# Patient Record
Sex: Male | Born: 1975 | Race: Black or African American | Hispanic: No | Marital: Single | Smoking: Light tobacco smoker
Health system: Southern US, Community
[De-identification: ages and names within clinical notes are randomized; demographics above are authoritative.]

---

## 1998-11-27 HISTORY — PX: BACK SURGERY: SHX140

## 2018-02-27 ENCOUNTER — Other Ambulatory Visit: Payer: Self-pay | Admitting: Orthopedic Surgery

## 2018-03-05 NOTE — Pre-Procedure Instructions (Signed)
Louis Robinson  03/05/2018      [id number] - [place], [place] - [address] [place] [id number] Phone: [phone] Fax: [phone]    Your procedure is scheduled on Thurs. April 11  [facility] at 11:00 A.M.  Call this number if you have problems the morning of surgery:  [phone]   Remember:  Do not eat food or drink liquids after midnight.  Take these medicines the morning of surgery with A SIP OF WATER : none             7 days prior to surgery STOP taking any Aspirin(unless otherwise instructed by your surgeon), Aleve, Naproxen, Ibuprofen, Motrin, Advil, Goody's, BC's, all herbal medications, fish oil, and all vitamins   Do not wear jewelry.  Do not wear lotions, powders, or perfumes, or deodorant.  Do not shave 48 hours prior to surgery.  Men may shave face and neck.  Do not bring valuables to the hospital.  [facility] is not responsible for any belongings or valuables.  Contacts, dentures or bridgework may not be worn into surgery.  Leave your suitcase in the car.  After surgery it may be brought to your room.  For patients admitted to the hospital, discharge time will be determined by your treatment team.  Patients discharged the day of surgery will not be allowed to drive home.    Special instructions:  [facility]- Preparing For Surgery  Before surgery, you can play an important role. Because skin is not sterile, your skin needs to be as free of germs as possible. You can reduce the number of germs on your skin by washing with CHG (chlorahexidine gluconate) Soap before surgery.  CHG is an antiseptic cleaner which kills germs and bonds with the skin to continue killing germs even after washing.  Please do not use if you have an allergy to CHG or antibacterial soaps. If your skin becomes reddened/irritated stop using the CHG.  Do not shave (including legs and underarms) for at least  48 hours prior to first CHG shower. It is OK to shave your face.  Please follow these instructions carefully.   1. Shower the NIGHT BEFORE SURGERY and the MORNING OF SURGERY with CHG.   2. If you chose to wash your hair, wash your hair first as usual with your normal shampoo.  3. After you shampoo, rinse your hair and body thoroughly to remove the shampoo.  4. Use CHG as you would any other liquid soap. You can apply CHG directly to the skin and wash gently with a scrungie or a clean washcloth.   5. Apply the CHG Soap to your body ONLY FROM THE NECK DOWN.  Do not use on open wounds or open sores. Avoid contact with your eyes, ears, mouth and genitals (private parts). Wash Face and genitals (private parts)  with your normal soap.  6. Wash thoroughly, paying special attention to the area where your surgery will be performed.  7. Thoroughly rinse your body with warm water from the neck down.  8. DO NOT shower/wash with your normal soap after using and rinsing off the CHG Soap.  9. Pat yourself dry with a CLEAN TOWEL.  10. Wear CLEAN PAJAMAS to bed the night before surgery, wear comfortable clothes the morning of surgery  11. Place CLEAN SHEETS on your bed the night of your first shower and DO NOT SLEEP WITH PETS.    Day of  Surgery: Do not apply any deodorants/lotions. Please wear clean clothes to the hospital/surgery center.      Please read over the following fact sheets that you were given. Coughing and Deep Breathing and Surgical Site Infection Prevention

## 2018-03-06 ENCOUNTER — Encounter (HOSPITAL_COMMUNITY): Payer: Self-pay

## 2018-03-06 ENCOUNTER — Encounter (HOSPITAL_COMMUNITY)
Admission: RE | Admit: 2018-03-06 | Discharge: 2018-03-06 | Disposition: A | Discharge status: Home / Self Care | Payer: BLUE CROSS/BLUE SHIELD | Source: Ambulatory Visit | Attending: Orthopedic Surgery | Admitting: Orthopedic Surgery

## 2018-03-06 ENCOUNTER — Other Ambulatory Visit: Payer: Self-pay

## 2018-03-06 DIAGNOSIS — M488X3 Other specified spondylopathies, cervicothoracic region: Secondary | ICD-10-CM | POA: Diagnosis not present

## 2018-03-06 DIAGNOSIS — M5412 Radiculopathy, cervical region: Secondary | ICD-10-CM | POA: Diagnosis present

## 2018-03-06 DIAGNOSIS — Z01818 Encounter for other preprocedural examination: Secondary | ICD-10-CM

## 2018-03-06 DIAGNOSIS — F17290 Nicotine dependence, other tobacco product, uncomplicated: Secondary | ICD-10-CM | POA: Diagnosis not present

## 2018-03-06 LAB — COMPREHENSIVE METABOLIC PANEL
ALBUMIN: 3.9 g/dL (ref 3.5–5.0)
ALT: 11 U/L — ABNORMAL LOW (ref 17–63)
ANION GAP: 12 (ref 5–15)
AST: 19 U/L (ref 15–41)
Alkaline Phosphatase: 80 U/L (ref 38–126)
BILIRUBIN TOTAL: 0.4 mg/dL (ref 0.3–1.2)
BUN: 8 mg/dL (ref 6–20)
CO2: 22 mmol/L (ref 22–32)
Calcium: 9.2 mg/dL (ref 8.9–10.3)
Chloride: 106 mmol/L (ref 101–111)
Creatinine, Ser: 1.29 mg/dL — ABNORMAL HIGH (ref 0.61–1.24)
GFR calc Af Amer: >60 mL/min (ref >60)
GFR calc non Af Amer: >60 mL/min (ref >60)
GLUCOSE: 90 mg/dL (ref 65–99)
POTASSIUM: 3.8 mmol/L (ref 3.5–5.1)
SODIUM: 140 mmol/L (ref 135–145)
TOTAL PROTEIN: 6.9 g/dL (ref 6.5–8.1)

## 2018-03-06 LAB — SURGICAL PCR SCREEN
MRSA, PCR: NEGATIVE
STAPHYLOCOCCUS AUREUS: NEGATIVE

## 2018-03-06 LAB — URINALYSIS, ROUTINE W REFLEX MICROSCOPIC
Bilirubin Urine: NEGATIVE
Glucose, UA: NEGATIVE mg/dL
HGB URINE DIPSTICK: NEGATIVE
Ketones, ur: NEGATIVE mg/dL
LEUKOCYTES UA: NEGATIVE
NITRITE: NEGATIVE
PH: 6 (ref 5.0–8.0)
Protein, ur: NEGATIVE mg/dL
Specific Gravity, Urine: 1.003 — ABNORMAL LOW (ref 1.005–1.030)

## 2018-03-06 LAB — CBC WITH DIFFERENTIAL/PLATELET
BASOS PCT: 0 %
Basophils Absolute: 0 10*3/uL (ref 0.0–0.1)
EOS ABS: 1.4 10*3/uL — AB (ref 0.0–0.7)
Eosinophils Relative: 14 %
HEMATOCRIT: 40.7 % (ref 39.0–52.0)
Hemoglobin: 13.4 g/dL (ref 13.0–17.0)
Lymphocytes Relative: 30 %
Lymphs Abs: 2.9 10*3/uL (ref 0.7–4.0)
MCH: 32.9 pg (ref 26.0–34.0)
MCHC: 32.9 g/dL (ref 30.0–36.0)
MCV: 100 fL (ref 78.0–100.0)
MONO ABS: 0.7 10*3/uL (ref 0.1–1.0)
MONOS PCT: 7 %
Neutro Abs: 4.8 10*3/uL (ref 1.7–7.7)
Neutrophils Relative %: 49 %
Platelets: 233 10*3/uL (ref 150–400)
RBC: 4.07 MIL/uL — ABNORMAL LOW (ref 4.22–5.81)
RDW: 14.6 % (ref 11.5–15.5)
WBC: 9.8 10*3/uL (ref 4.0–10.5)

## 2018-03-06 LAB — PROTIME-INR
INR: 0.98
Prothrombin Time: 12.9 seconds (ref 11.4–15.2)

## 2018-03-06 LAB — APTT: aPTT: 32 seconds (ref 24–36)

## 2018-03-06 NOTE — Progress Notes (Signed)
NO PCP

## 2018-03-07 ENCOUNTER — Ambulatory Visit (HOSPITAL_COMMUNITY)
Admission: RE | Disposition: A | Discharge status: Home / Self Care | Payer: Self-pay | Source: Ambulatory Visit | Attending: Orthopedic Surgery

## 2018-03-07 ENCOUNTER — Ambulatory Visit (HOSPITAL_COMMUNITY)
Admission: RE | Admit: 2018-03-07 | Discharge: 2018-03-07 | Disposition: A | Discharge status: Home / Self Care | Payer: BLUE CROSS/BLUE SHIELD | Source: Ambulatory Visit | Attending: Orthopedic Surgery | Admitting: Orthopedic Surgery

## 2018-03-07 ENCOUNTER — Encounter (HOSPITAL_COMMUNITY): Payer: Self-pay | Admitting: *Deleted

## 2018-03-07 ENCOUNTER — Ambulatory Visit (HOSPITAL_COMMUNITY): Payer: BLUE CROSS/BLUE SHIELD

## 2018-03-07 ENCOUNTER — Ambulatory Visit (HOSPITAL_COMMUNITY): Payer: BLUE CROSS/BLUE SHIELD | Admitting: Certified Registered"

## 2018-03-07 DIAGNOSIS — M488X3 Other specified spondylopathies, cervicothoracic region: Secondary | ICD-10-CM | POA: Insufficient documentation

## 2018-03-07 DIAGNOSIS — M5412 Radiculopathy, cervical region: Secondary | ICD-10-CM | POA: Diagnosis not present

## 2018-03-07 DIAGNOSIS — F17290 Nicotine dependence, other tobacco product, uncomplicated: Secondary | ICD-10-CM | POA: Insufficient documentation

## 2018-03-07 DIAGNOSIS — M5010 Cervical disc disorder with radiculopathy, unspecified cervical region: Secondary | ICD-10-CM

## 2018-03-07 DIAGNOSIS — Z419 Encounter for procedure for purposes other than remedying health state, unspecified: Secondary | ICD-10-CM

## 2018-03-07 HISTORY — PX: POSTERIOR CERVICAL FUSION/FORAMINOTOMY: SHX5038

## 2018-03-07 SURGERY — POSTERIOR CERVICAL FUSION/FORAMINOTOMY LEVEL 1
Anesthesia: General | Laterality: Right

## 2018-03-07 MED ORDER — LACTATED RINGERS IV SOLN
INTRAVENOUS | Status: DC
  Administered 2018-03-07 (×2): via INTRAVENOUS

## 2018-03-07 MED ORDER — CEFAZOLIN SODIUM-DEXTROSE 2-4 GM/100ML-% IV SOLN
2.0000 g | INTRAVENOUS | Status: AC
  Administered 2018-03-07: 2 g via INTRAVENOUS

## 2018-03-07 MED ORDER — BUPIVACAINE-EPINEPHRINE (PF) 0.25% -1:200000 IJ SOLN
INTRAMUSCULAR | Status: AC
Start: 2018-03-07 — End: 2018-03-07
  Filled 2018-03-07: qty 30

## 2018-03-07 MED ORDER — BUPIVACAINE LIPOSOME 1.3 % IJ SUSP
20.0000 mL | INTRAMUSCULAR | Status: AC
  Administered 2018-03-07: 20 mL
  Filled 2018-03-07: qty 20

## 2018-03-07 MED ORDER — FENTANYL CITRATE (PF) 250 MCG/5ML IJ SOLN
INTRAMUSCULAR | Status: AC
  Filled 2018-03-07: qty 5

## 2018-03-07 MED ORDER — MIDAZOLAM HCL 2 MG/2ML IJ SOLN
INTRAMUSCULAR | Status: AC
Start: 2018-03-07 — End: 2018-03-07
  Filled 2018-03-07: qty 2

## 2018-03-07 MED ORDER — THROMBIN (RECOMBINANT) 20000 UNITS EX SOLR
CUTANEOUS | Status: DC | PRN
  Administered 2018-03-07: 20000 [IU] via TOPICAL

## 2018-03-07 MED ORDER — CEFAZOLIN SODIUM-DEXTROSE 2-4 GM/100ML-% IV SOLN
INTRAVENOUS | Status: AC
  Filled 2018-03-07: qty 100

## 2018-03-07 MED ORDER — POVIDONE-IODINE 7.5 % EX SOLN
Freq: Once | CUTANEOUS | Status: AC
  Administered 2018-03-07: 1 via TOPICAL
  Filled 2018-03-07: qty 118

## 2018-03-07 MED ORDER — LIDOCAINE 2% (20 MG/ML) 5 ML SYRINGE
INTRAMUSCULAR | Status: AC
  Filled 2018-03-07: qty 5

## 2018-03-07 MED ORDER — METHYLPREDNISOLONE ACETATE 40 MG/ML IJ SUSP
INTRAMUSCULAR | Status: AC
Start: 2018-03-07 — End: 2018-03-07
  Filled 2018-03-07: qty 1

## 2018-03-07 MED ORDER — BUPIVACAINE-EPINEPHRINE 0.25% -1:200000 IJ SOLN
INTRAMUSCULAR | Status: DC | PRN
  Administered 2018-03-07: 20 mL
  Administered 2018-03-07: 8 mL

## 2018-03-07 MED ORDER — METHYLPREDNISOLONE ACETATE 40 MG/ML IJ SUSP
INTRAMUSCULAR | Status: DC | PRN
  Administered 2018-03-07: 40 mg

## 2018-03-07 MED ORDER — LIDOCAINE 2% (20 MG/ML) 5 ML SYRINGE
INTRAMUSCULAR | Status: DC | PRN
  Administered 2018-03-07 (×2): 60 mg via INTRAVENOUS

## 2018-03-07 MED ORDER — MIDAZOLAM HCL 5 MG/5ML IJ SOLN
INTRAMUSCULAR | Status: DC | PRN
  Administered 2018-03-07: 2 mg via INTRAVENOUS

## 2018-03-07 MED ORDER — ONDANSETRON HCL 4 MG/2ML IJ SOLN
INTRAMUSCULAR | Status: DC | PRN
  Administered 2018-03-07: 4 mg via INTRAVENOUS

## 2018-03-07 MED ORDER — THROMBIN 5000 UNITS EX SOLR
CUTANEOUS | Status: DC | PRN
  Administered 2018-03-07: 5000 [IU] via TOPICAL

## 2018-03-07 MED ORDER — FENTANYL CITRATE (PF) 250 MCG/5ML IJ SOLN
INTRAMUSCULAR | Status: DC | PRN
  Administered 2018-03-07: 100 ug via INTRAVENOUS
  Administered 2018-03-07 (×2): 50 ug via INTRAVENOUS

## 2018-03-07 MED ORDER — ROCURONIUM BROMIDE 10 MG/ML (PF) SYRINGE
PREFILLED_SYRINGE | INTRAVENOUS | Status: DC | PRN
  Administered 2018-03-07 (×2): 40 mg via INTRAVENOUS
  Administered 2018-03-07: 50 mg via INTRAVENOUS
  Administered 2018-03-07: 20 mg via INTRAVENOUS

## 2018-03-07 MED ORDER — ROCURONIUM BROMIDE 10 MG/ML (PF) SYRINGE
PREFILLED_SYRINGE | INTRAVENOUS | Status: AC
  Filled 2018-03-07: qty 5

## 2018-03-07 MED ORDER — THROMBIN 20000 UNITS EX SOLR
CUTANEOUS | Status: AC
  Filled 2018-03-07: qty 20000

## 2018-03-07 MED ORDER — BACITRACIN ZINC 500 UNIT/GM EX OINT
TOPICAL_OINTMENT | CUTANEOUS | Status: DC | PRN
  Administered 2018-03-07: 1 via TOPICAL

## 2018-03-07 MED ORDER — PHENYLEPHRINE 40 MCG/ML (10ML) SYRINGE FOR IV PUSH (FOR BLOOD PRESSURE SUPPORT)
PREFILLED_SYRINGE | INTRAVENOUS | Status: DC | PRN
  Administered 2018-03-07: 80 ug via INTRAVENOUS

## 2018-03-07 MED ORDER — BACITRACIN ZINC 500 UNIT/GM EX OINT
TOPICAL_OINTMENT | CUTANEOUS | Status: AC
  Filled 2018-03-07: qty 28.35

## 2018-03-07 MED ORDER — THROMBIN 5000 UNITS EX SOLR
CUTANEOUS | Status: AC
  Filled 2018-03-07: qty 5000

## 2018-03-07 MED ORDER — PROPOFOL 10 MG/ML IV BOLUS
INTRAVENOUS | Status: DC | PRN
  Administered 2018-03-07: 200 mg via INTRAVENOUS

## 2018-03-07 MED ORDER — SUGAMMADEX SODIUM 200 MG/2ML IV SOLN
INTRAVENOUS | Status: DC | PRN
  Administered 2018-03-07 (×2): 200 mg via INTRAVENOUS

## 2018-03-07 MED ORDER — 0.9 % SODIUM CHLORIDE (POUR BTL) OPTIME
TOPICAL | Status: DC | PRN
  Administered 2018-03-07: 1000 mL

## 2018-03-07 MED ORDER — HYDROMORPHONE HCL 1 MG/ML IJ SOLN: 0.2500 mg | INTRAMUSCULAR | Status: DC | PRN

## 2018-03-07 MED ORDER — PHENYLEPHRINE HCL 10 MG/ML IJ SOLN
INTRAVENOUS | Status: DC | PRN
  Administered 2018-03-07: 15 ug/min via INTRAVENOUS

## 2018-03-07 SURGICAL SUPPLY — 64 items
BENZOIN TINCTURE PRP APPL 2/3 (GAUZE/BANDAGES/DRESSINGS) ×3 IMPLANT
BLADE CLIPPER SURG NEURO (BLADE) ×3 IMPLANT
BUR NEURO DRILL SOFT 3.0X3.8M (BURR) ×3 IMPLANT
BUR PRESCISION 1.7 ELITE (BURR) ×3 IMPLANT
CLOSURE WOUND 1/2 X4 (GAUZE/BANDAGES/DRESSINGS) ×1
COLLAR CERV PROCARE ST 2.25 (SOFTGOODS) ×3 IMPLANT
CONT SPEC 4OZ CLIKSEAL STRL BL (MISCELLANEOUS) ×3 IMPLANT
CORDS BIPOLAR (ELECTRODE) ×3 IMPLANT
COVER BACK TABLE 80X110 HD (DRAPES) ×3 IMPLANT
COVER MAYO STAND STRL (DRAPES) ×3 IMPLANT
COVER SURGICAL LIGHT HANDLE (MISCELLANEOUS) ×3 IMPLANT
DRAIN CHANNEL 15F RND FF W/TCR (WOUND CARE) ×3 IMPLANT
DRAPE C-ARM 42X72 X-RAY (DRAPES) ×3 IMPLANT
DRAPE HALF SHEET 40X57 (DRAPES) ×15 IMPLANT
DRAPE INCISE IOBAN 66X45 STRL (DRAPES) ×3 IMPLANT
DRAPE PED LAPAROTOMY (DRAPES) ×3 IMPLANT
DRAPE POUCH INSTRU U-SHP 10X18 (DRAPES) ×3 IMPLANT
DRAPE SURG 17X23 STRL (DRAPES) ×24 IMPLANT
DRSG MEPILEX BORDER 4X8 (GAUZE/BANDAGES/DRESSINGS) ×3 IMPLANT
DURAPREP 26ML APPLICATOR (WOUND CARE) ×3 IMPLANT
ELECT CAUTERY BLADE 6.4 (BLADE) ×3 IMPLANT
ELECT REM PT RETURN 9FT ADLT (ELECTROSURGICAL) ×3
ELECTRODE REM PT RTRN 9FT ADLT (ELECTROSURGICAL) ×1 IMPLANT
EVACUATOR SILICONE 100CC (DRAIN) ×3 IMPLANT
GAUZE SPONGE 4X4 12PLY STRL (GAUZE/BANDAGES/DRESSINGS) ×3 IMPLANT
GAUZE SPONGE 4X4 16PLY XRAY LF (GAUZE/BANDAGES/DRESSINGS) ×3 IMPLANT
GLOVE BIO SURGEON STRL SZ7 (GLOVE) ×3 IMPLANT
GLOVE BIO SURGEON STRL SZ8 (GLOVE) ×3 IMPLANT
GLOVE BIOGEL PI IND STRL 8 (GLOVE) ×1 IMPLANT
GLOVE BIOGEL PI INDICATOR 8 (GLOVE) ×2
GOWN STRL REUS W/ TWL LRG LVL3 (GOWN DISPOSABLE) ×4 IMPLANT
GOWN STRL REUS W/ TWL XL LVL3 (GOWN DISPOSABLE) ×1 IMPLANT
GOWN STRL REUS W/TWL LRG LVL3 (GOWN DISPOSABLE) ×8
GOWN STRL REUS W/TWL XL LVL3 (GOWN DISPOSABLE) ×2
IV CATH 14GX2 1/4 (CATHETERS) ×3 IMPLANT
KIT BASIN OR (CUSTOM PROCEDURE TRAY) ×3 IMPLANT
KIT TURNOVER KIT B (KITS) ×3 IMPLANT
MATRIX HEMOSTAT SURGIFLO (HEMOSTASIS) ×3 IMPLANT
NEEDLE HYPO 25GX1X1/2 BEV (NEEDLE) ×3 IMPLANT
NS IRRIG 1000ML POUR BTL (IV SOLUTION) ×3 IMPLANT
PACK LAMINECTOMY ORTHO (CUSTOM PROCEDURE TRAY) ×3 IMPLANT
PAD ARMBOARD 7.5X6 YLW CONV (MISCELLANEOUS) ×6 IMPLANT
PATTIES SURGICAL .5 X.5 (GAUZE/BANDAGES/DRESSINGS) ×3 IMPLANT
PIN MAYFIELD SKULL DISP (PIN) ×3 IMPLANT
SPONGE GAUZE 4X4 16PLY UNSTER (WOUND CARE) ×3 IMPLANT
SPONGE INTESTINAL PEANUT (DISPOSABLE) IMPLANT
SPONGE SURGIFOAM ABS GEL 100 (HEMOSTASIS) ×3 IMPLANT
STRIP CLOSURE SKIN 1/2X4 (GAUZE/BANDAGES/DRESSINGS) ×2 IMPLANT
SURGIFLO W/THROMBIN 8M KIT (HEMOSTASIS) ×3 IMPLANT
SUT MNCRL AB 4-0 PS2 18 (SUTURE) ×3 IMPLANT
SUT VIC AB 0 CT1 18XCR BRD 8 (SUTURE) ×1 IMPLANT
SUT VIC AB 0 CT1 8-18 (SUTURE) ×2
SUT VIC AB 1 CT1 18XCR BRD 8 (SUTURE) ×2 IMPLANT
SUT VIC AB 1 CT1 8-18 (SUTURE) ×4
SUT VIC AB 2-0 CT2 18 VCP726D (SUTURE) ×3 IMPLANT
SYR BULB IRRIGATION 50ML (SYRINGE) ×3 IMPLANT
SYR CONTROL 10ML LL (SYRINGE) ×9 IMPLANT
TAPE CLOTH 4X10 WHT NS (GAUZE/BANDAGES/DRESSINGS) ×3 IMPLANT
TAPE CLOTH SURG 4X10 WHT LF (GAUZE/BANDAGES/DRESSINGS) ×3 IMPLANT
TOWEL OR 17X24 6PK STRL BLUE (TOWEL DISPOSABLE) ×3 IMPLANT
TOWEL OR 17X26 10 PK STRL BLUE (TOWEL DISPOSABLE) ×3 IMPLANT
TRAY FOLEY W/METER SILVER 16FR (SET/KITS/TRAYS/PACK) IMPLANT
WATER STERILE IRR 1000ML POUR (IV SOLUTION) IMPLANT
YANKAUER SUCT BULB TIP NO VENT (SUCTIONS) ×3 IMPLANT

## 2018-03-07 NOTE — Anesthesia Postprocedure Evaluation (Signed)
Anesthesia Post Note  Patient: Louis Robinson  Procedure(s) Performed: CERVICAL 7 - THORACIC 1 POSTERIOR DECOMPRESSION (Right )     Patient location during evaluation: PACU Anesthesia Type: General Level of consciousness: awake and alert Pain management: pain level controlled Vital Signs Assessment: post-procedure vital signs reviewed and stable Respiratory status: spontaneous breathing, nonlabored ventilation and respiratory function stable Cardiovascular status: blood pressure returned to baseline and stable Postop Assessment: no apparent nausea or vomiting Anesthetic complications: no    Last Vitals:  Vitals:   03/07/18 1629 03/07/18 1641  BP: (!) 147/92   Pulse: 66   Resp:    Temp:  36.7 C  SpO2: 100%     Last Pain:  Vitals:   03/07/18 1641  PainSc: 0-No pain                 Kenidi Elenbaas,W. EDMOND

## 2018-03-07 NOTE — Anesthesia Procedure Notes (Addendum)
Procedure Name: Intubation Date/Time: 03/07/2018 12:37 PM Performed by: Freddie Breech, CRNA Pre-anesthesia Checklist: Patient identified, Emergency Drugs available, Suction available and Patient being monitored Patient Re-evaluated:Patient Re-evaluated prior to induction Oxygen Delivery Method: Circle System Utilized Preoxygenation: Pre-oxygenation with 100% oxygen Induction Type: IV induction Ventilation: Mask ventilation without difficulty Laryngoscope Size: Glidescope and 4 Grade View: Grade I Tube type: Oral Tube size: 7.5 mm Number of attempts: 1 Airway Equipment and Method: Stylet,  Oral airway,  Video-laryngoscopy and LTA kit utilized Placement Confirmation: positive ETCO2 and breath sounds checked- equal and bilateral (ETT placement through open vocal cords confirmed with video laryngoscopy.  ) Secured at: 23 cm Tube secured with: Tape Dental Injury: Teeth and Oropharynx as per pre-operative assessment

## 2018-03-07 NOTE — Anesthesia Preprocedure Evaluation (Addendum)
Anesthesia Evaluation  Patient identified by MRN, date of birth, ID band Patient awake    Reviewed: Allergy & Precautions, H&P , NPO status , Patient's Chart, lab work & pertinent test results  Airway Mallampati: II  TM Distance: >3 FB Neck ROM: Full    Dental no notable dental hx. (+) Teeth Intact, Dental Advisory Given   Pulmonary Current Smoker,    Pulmonary exam normal breath sounds clear to auscultation       Cardiovascular negative cardio ROS   Rhythm:Regular Rate:Normal     Neuro/Psych negative neurological ROS  negative psych ROS   GI/Hepatic negative GI ROS, Neg liver ROS,   Endo/Other  negative endocrine ROS  Renal/GU negative Renal ROS  negative genitourinary   Musculoskeletal   Abdominal   Peds  Hematology negative hematology ROS (+)   Anesthesia Other Findings   Reproductive/Obstetrics negative OB ROS                            Anesthesia Physical Anesthesia Plan  ASA: II  Anesthesia Plan: General   Post-op Pain Management:    Induction: Intravenous  PONV Risk Score and Plan: 2 and Ondansetron, Dexamethasone and Midazolam  Airway Management Planned: Oral ETT  Additional Equipment:   Intra-op Plan:   Post-operative Plan: Extubation in OR  Informed Consent: I have reviewed the patients History and Physical, chart, labs and discussed the procedure including the risks, benefits and alternatives for the proposed anesthesia with the patient or authorized representative who has indicated his/her understanding and acceptance.     Dental advisory given  Plan Discussed with: CRNA  Anesthesia Plan Comments:         Anesthesia Quick Evaluation  

## 2018-03-07 NOTE — Transfer of Care (Signed)
Immediate Anesthesia Transfer of Care Note  Patient: Louis Robinson  Procedure(s) Performed: CERVICAL 7 - THORACIC 1 POSTERIOR DECOMPRESSION (Right )  Patient Location: PACU  Anesthesia Type:General  Level of Consciousness: drowsy and patient cooperative  Airway & Oxygen Therapy: Patient Spontanous Breathing  Post-op Assessment: Report given to RN, Post -op Vital signs reviewed and stable and Patient moving all extremities X 4  Post vital signs: Reviewed and stable  Last Vitals:  Vitals Value Taken Time  BP 149/96 03/07/2018  3:36 PM  Temp    Pulse 79 03/07/2018  3:38 PM  Resp 24 03/07/2018  3:38 PM  SpO2 97 % 03/07/2018  3:38 PM  Vitals shown include unvalidated device data.  Last Pain:  Vitals:   03/07/18 0944  PainSc: 0-No pain      Patients Stated Pain Goal: 4 (03/07/18 0944)  Complications: No apparent anesthesia complications

## 2018-03-07 NOTE — H&P (Signed)
     PREOPERATIVE H&P  Chief Complaint: Right arm pain  HPI: Louis Robinson is a 42 y.o. male who presents with ongoing pain in the right arm  MRI reveals NF compression on the right at C7/T1  Patient has failed multiple forms of conservative care and continues to have pain (see office notes for additional details regarding the patient's full course of treatment)  No past medical history on file. Past Surgical History:  Procedure Laterality Date  . BACK SURGERY  2000   Social History   Socioeconomic History  . Marital status: Single    Spouse name: Not on file  . Number of children: Not on file  . Years of education: Not on file  . Highest education level: Not on file  Occupational History  . Not on file  Social Needs  . Financial resource strain: Not on file  . Food insecurity:    Worry: Not on file    Inability: Not on file  . Transportation needs:    Medical: Not on file    Non-medical: Not on file  Tobacco Use  . Smoking status: Light Tobacco Smoker    Years: 2.00    Types: Cigars  . Smokeless tobacco: Never Used  . Tobacco comment: onec in awhile  Substance and Sexual Activity  . Alcohol use: Never    Frequency: Never  . Drug use: Not Currently    Types: Marijuana    Comment: last usage several yrs. ago  . Sexual activity: Not on file  Lifestyle  . Physical activity:    Days per week: Not on file    Minutes per session: Not on file  . Stress: Not on file  Relationships  . Social connections:    Talks on phone: Not on file    Gets together: Not on file    Attends religious service: Not on file    Active member of club or organization: Not on file    Attends meetings of clubs or organizations: Not on file    Relationship status: Not on file  Other Topics Concern  . Not on file  Social History Narrative  . Not on file   No family history on file. No Known Allergies Prior to Admission medications   Not on File     All other systems have  been reviewed and were otherwise negative with the exception of those mentioned in the HPI and as above.  Physical Exam: There were no vitals filed for this visit.  There is no height or weight on file to calculate BMI.  General: Alert, no acute distress Cardiovascular: No pedal edema Respiratory: No cyanosis, no use of accessory musculature Skin: No lesions in the area of chief complaint Neurologic: Sensation intact distally Psychiatric: Patient is competent for consent with normal mood and affect Lymphatic: No axillary or cervical lymphadenopathy   Assessment/Plan: RIGHT ARM PAIN Plan for Procedure(s): CERVICAL 7 - THORACIC 1 POSTERIOR DECOMPRESSION   Louis Robinson,Louis Faraci LEONARD, MD 03/07/2018 6:31 AM

## 2018-03-07 NOTE — Op Note (Signed)
NAME:  Louis Robinson             MEDICAL RECORD NO.:  [id number]  PHYSICIAN:  Estill Bamberg, MD      DATE OF BIRTH:  Sep 29, 1976  DATE OF PROCEDURE:  03/07/2018                               OPERATIVE REPORT   PREOPERATIVE DIAGNOSES: 1. Right C8 radiculopathy 2, Right C7/T1 facet hypertrophy, compressing the right C8 nerve  POSTOPERATIVE DIAGNOSES: 1. Right C8 radiculopathy 2, Right C7/T1 facet hypertrophy, compressing the right C8 nerve  PROCEDURE:   1. Right C7/T1 laminotomy, partial facetectomy and foraminotomy with decompression of right C8 nerve 2. Mayfield headholder placement and removal  SURGEON:  Estill Bamberg, MD.  ASSISTANTJason Coop, PA-C.  ANESTHESIA:  General endotracheal anesthesia.  COMPLICATIONS:  None.  DISPOSITION:  Stable.  ESTIMATED BLOOD LOSS:  Minimal.  INDICATIONS FOR SURGERY:  Briefly, Louis Robinson is a very pleasant 42 year-old male, who did present to me with pain in the right arm. The patient's MRI did reveal the findings noted above.  We did proceed with appropriate conservative treatment, but the patient did continue to have ongoing pain, which he did feel was limiting his function substantially.  Given the patient's ongoing pain and dysfunction, we did discuss proceeding with the procedure reflected above.  The patient was fully aware of the risks and limitations of surgery and did wish to proceed.  OPERATIVE DETAILS:  On 03/07/2018, the patient was brought to surgery and general endotracheal anesthesia was administered.  A Mayfield head holder was then applied, and the patient was then rolled prone, and the head positioned into the appropriate position. Antibiotics were given and the back was prepped and draped in the usual sterile fashion.  A time-out procedure was performed.  I then made a midline incision overlying the C7-T1 intervertebral space.  The fascia was incised to the right of the midline.  The paraspinal musculature  was bluntly retracted laterally and held retracted with a self-retaining retractor. After confirming the appropriate operative level, I did perform a partial facetectomy on the right at C7-T1. Of note, there was very substantial and very significant overgrowth of the facet joint, which was clearly compressing the right C8 nerve. The right C8 nerve was noted to be very swollen and erythematous due to the compression.  I proceeded with the partial facetectomy, until I was easily able to pass a nerve hook lateral to the C7 and T1 pedicles, confirming adequate decompression of the exiting right C8 nerve.  I was very pleased with the final decompression.  All bleeding was then adequately controlled at the termination of the decompression.  At this point, 40 mg of Depo-Medrol was introduced about the region of the exiting C8 nerve.  Prior to this, the wound was copiously irrigated with a total of approximately 1 L of normal saline.  There was no extravasation of cerebrospinal fluid noted throughout the entire surgery.  At this point, the wound was closed in layers using #1 Vicryl, followed by 2-0 Vicryl, followed by 4- 0 Monocryl.  Benzoin and Steri-Strips were applied, followed by a sterile dressing.  The patient was then rolled into the supine position, and the Mayfield head holder was removed. All instrument counts were correct at the termination of the procedure.  Of note, Jason Coop, PA-C, was my assistant throughout surgery, and did aid in retraction,  suctioning, and closure from start to finish.   Estill BambergMark Wendle Kina, MD

## 2018-03-08 ENCOUNTER — Encounter (HOSPITAL_COMMUNITY): Payer: Self-pay | Admitting: Orthopedic Surgery

## 2018-03-08 MED FILL — Thrombin For Soln 20000 Unit: CUTANEOUS | Qty: 1 | Status: AC

## 2019-07-24 IMAGING — CR DG CHEST 2V
2 series · 2 of 2 positions shown · non-contrast
Comparison: None.

CLINICAL DATA: Pre-op for cervical fusion surgery - part time
smoker, no known heart or lung issues, no chest complaints

EXAM:
CHEST - 2 VIEW

[w chest pa]
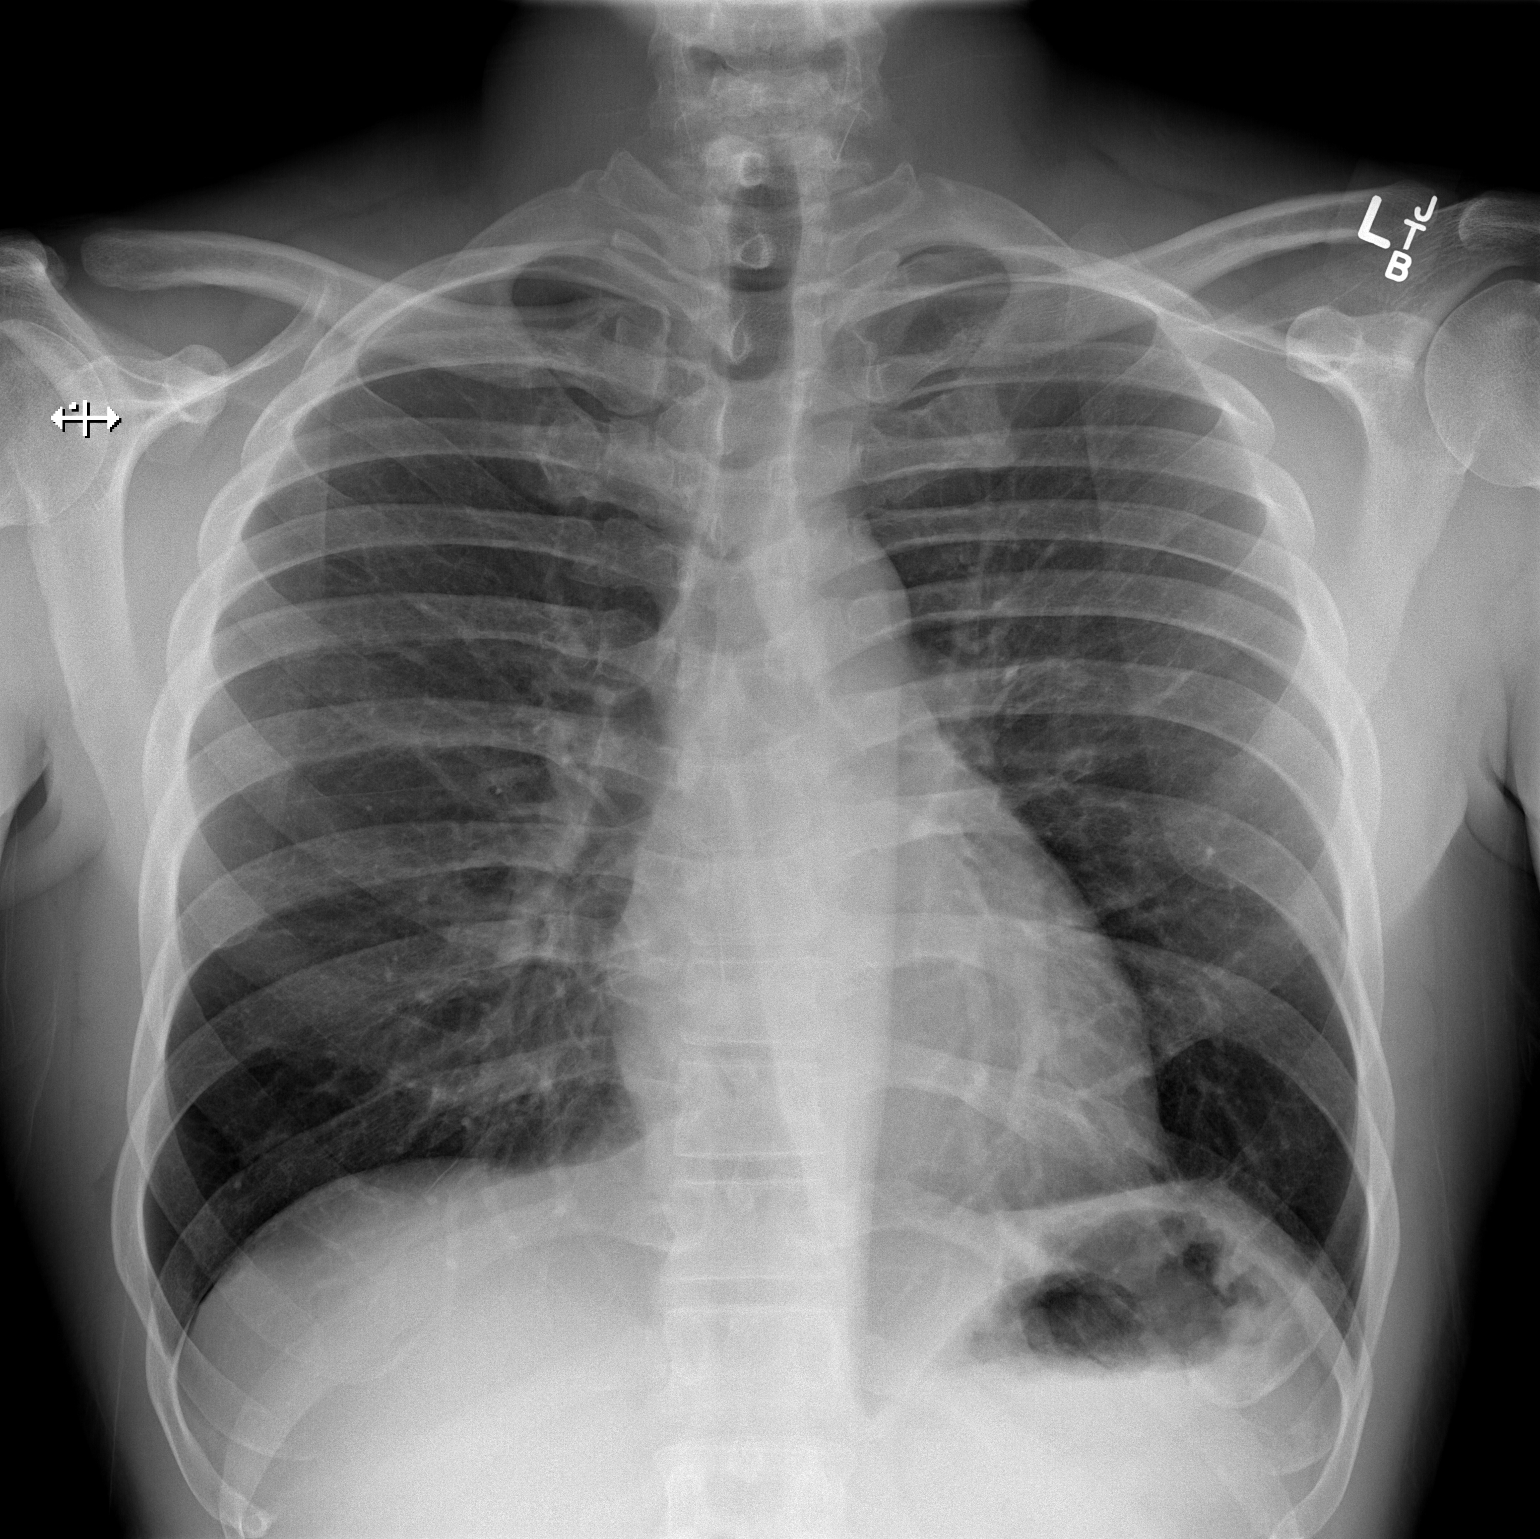

[w chest lat]
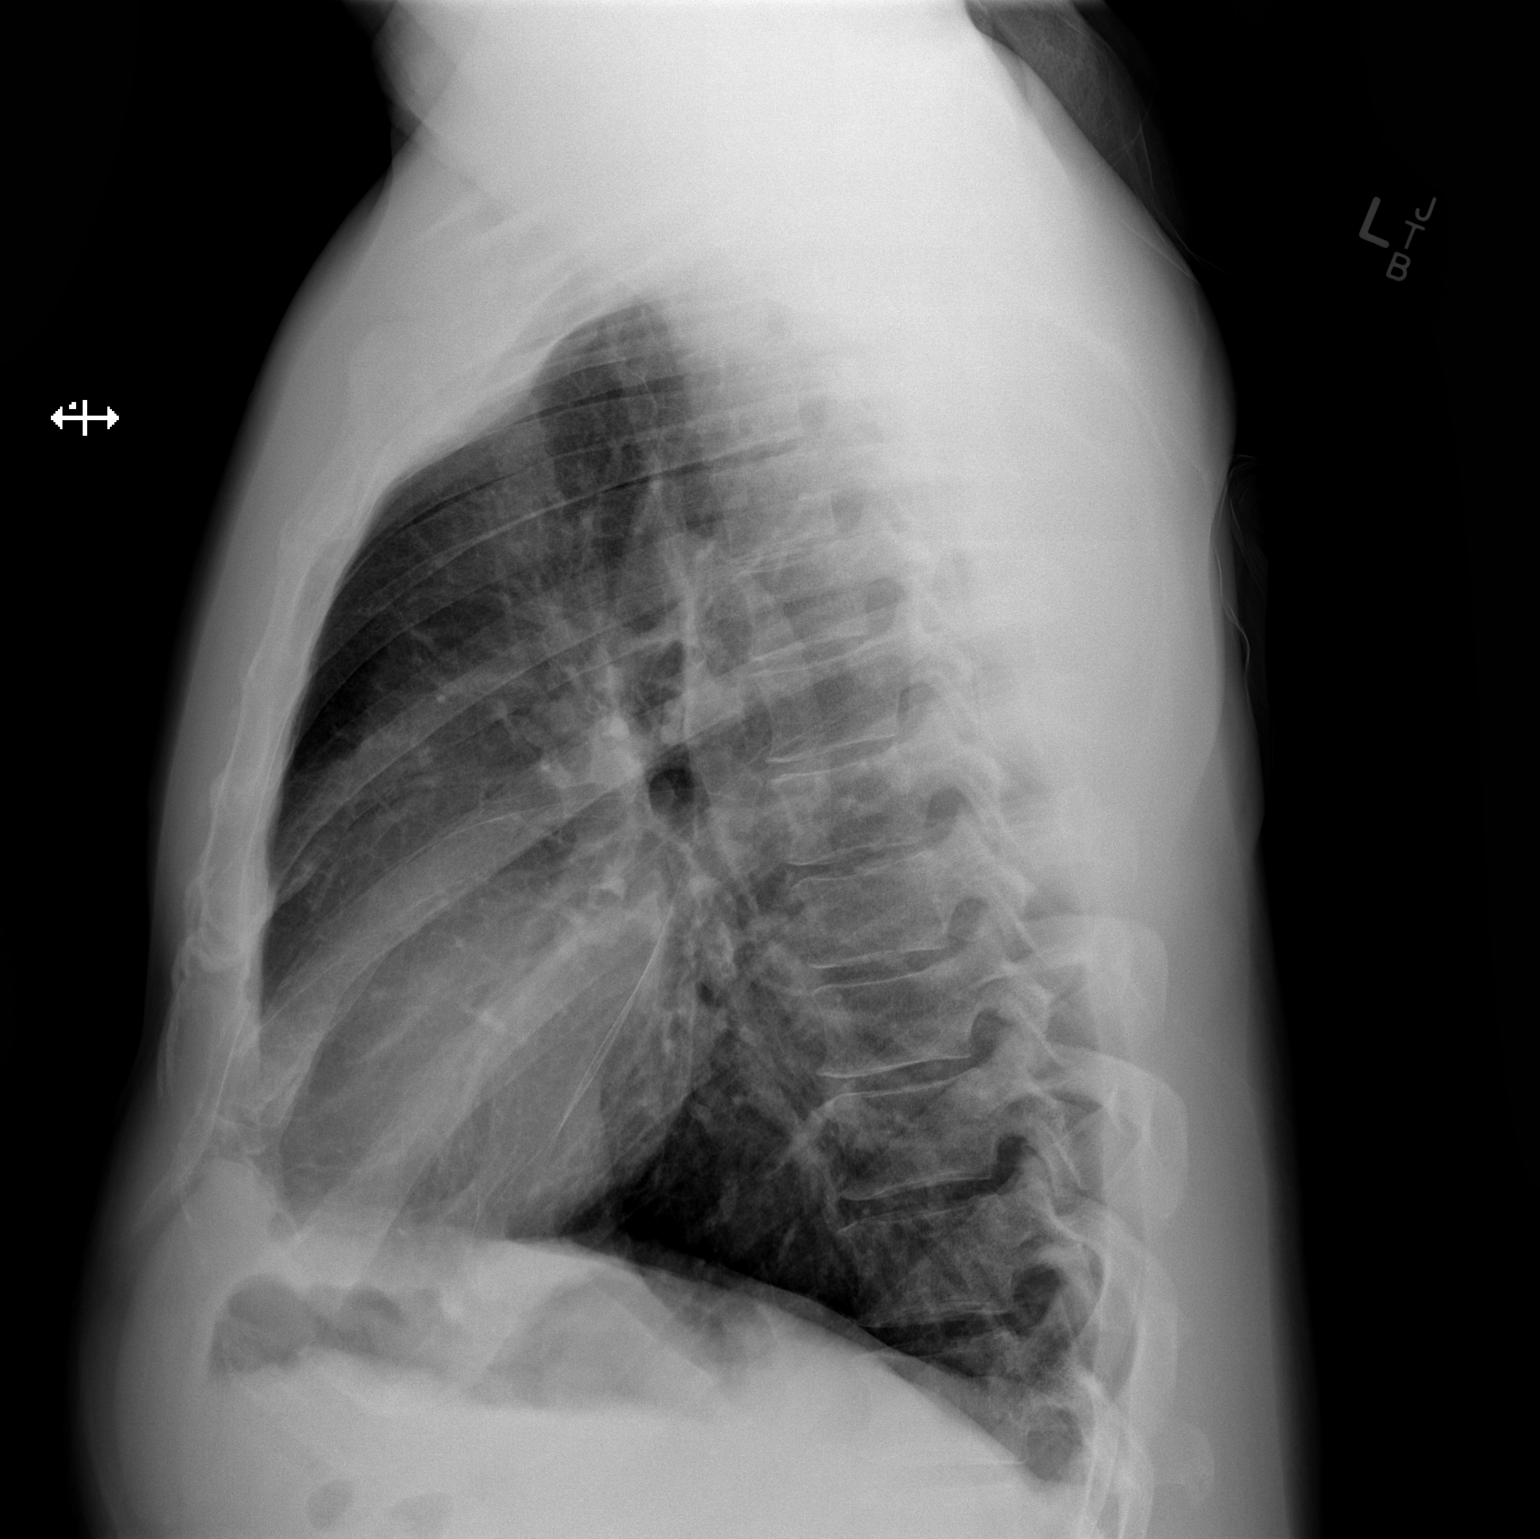

[2 of 2 positions shown; findings below may reference images not displayed]

FINDINGS: The heart size and mediastinal contours are within normal limits.
Both lungs are clear. The visualized skeletal structures are
unremarkable.
IMPRESSION: No active cardiopulmonary disease.

## 2019-07-25 IMAGING — CR DG THORACIC SPINE 1V
1 series · 1 of 1 positions shown · non-contrast
Comparison: None.

CLINICAL DATA: C7-T1 posterior decompression

EXAM:
OPERATIVE THORACIC SPINE 1 VIEW

[AP]
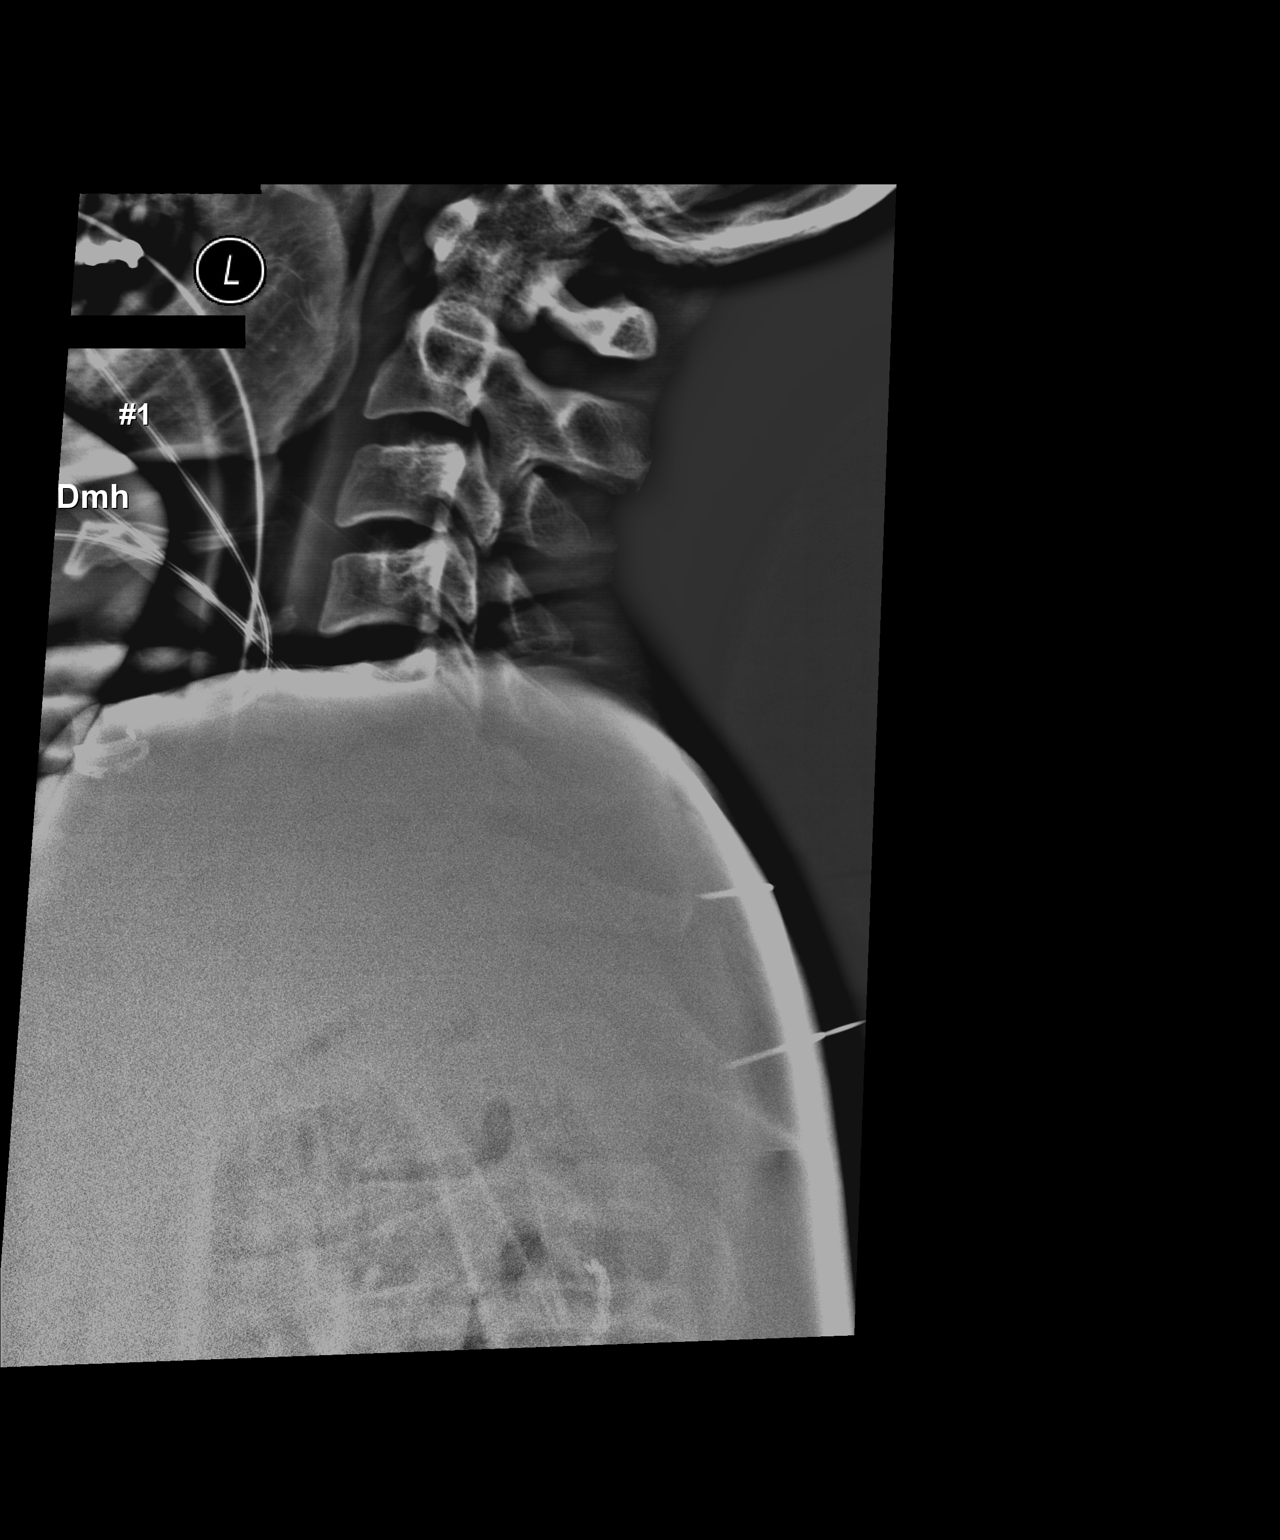

[1 of 1 positions shown; findings below may reference images not displayed]

FINDINGS: Cross-table lateral cervical and upper thoracic spine labeled #1
submitted. There is limited visualization inferior to C5. The
metallic probe tips appear posterior to the C7-T1 interspace and
posterior to the inferior aspect of the T2 vertebral body. No
fracture or spondylolisthesis evident on limited examination.
IMPRESSION: Limited examination due to shoulder artifact. Metallic probe tips
appear to be posterior to the C7-T1 interspace and inferior aspect
of the T2 vertebral body respectively. No demonstrable fracture or
spondylolisthesis.
